# Patient Record
Sex: Male | Born: 1975 | Race: White | Hispanic: No | Marital: Married | Smoking: Never smoker
Health system: Southern US, Community
[De-identification: ages and names within clinical notes are randomized; demographics above are authoritative.]

## PROBLEM LIST (undated history)

## (undated) DIAGNOSIS — I219 Acute myocardial infarction, unspecified: Secondary | ICD-10-CM

## (undated) DIAGNOSIS — E785 Hyperlipidemia, unspecified: Secondary | ICD-10-CM

## (undated) HISTORY — DX: Hyperlipidemia, unspecified: E78.5

## (undated) HISTORY — DX: Acute myocardial infarction, unspecified: I21.9

---

## 2006-05-13 HISTORY — PX: HERNIA REPAIR: SHX51

## 2019-04-13 HISTORY — PX: CORONARY ANGIOPLASTY WITH STENT PLACEMENT: SHX49

## 2019-05-03 ENCOUNTER — Other Ambulatory Visit: Payer: Self-pay

## 2019-05-03 ENCOUNTER — Ambulatory Visit (INDEPENDENT_AMBULATORY_CARE_PROVIDER_SITE_OTHER): Payer: PRIVATE HEALTH INSURANCE | Admitting: Nurse Practitioner

## 2019-05-03 ENCOUNTER — Encounter (INDEPENDENT_AMBULATORY_CARE_PROVIDER_SITE_OTHER): Payer: Self-pay

## 2019-05-03 ENCOUNTER — Encounter (INDEPENDENT_AMBULATORY_CARE_PROVIDER_SITE_OTHER): Payer: Self-pay | Admitting: Nurse Practitioner

## 2019-05-03 ENCOUNTER — Encounter: Payer: Self-pay | Admitting: Internal Medicine

## 2019-05-03 VITALS — BP 116/76 | HR 70 | Temp 97.3°F | Resp 14 | Ht 76.0 in | Wt 268.0 lb

## 2019-05-03 DIAGNOSIS — R5383 Other fatigue: Secondary | ICD-10-CM

## 2019-05-03 DIAGNOSIS — R42 Dizziness and giddiness: Secondary | ICD-10-CM

## 2019-05-03 DIAGNOSIS — Z0001 Encounter for general adult medical examination with abnormal findings: Secondary | ICD-10-CM | POA: Insufficient documentation

## 2019-05-03 DIAGNOSIS — E785 Hyperlipidemia, unspecified: Secondary | ICD-10-CM

## 2019-05-03 DIAGNOSIS — Z131 Encounter for screening for diabetes mellitus: Secondary | ICD-10-CM

## 2019-05-03 DIAGNOSIS — I2510 Atherosclerotic heart disease of native coronary artery without angina pectoris: Secondary | ICD-10-CM

## 2019-05-03 HISTORY — DX: Atherosclerotic heart disease of native coronary artery without angina pectoris: I25.10

## 2019-05-03 NOTE — Patient Instructions (Signed)
Thank you for choosing [facility] as your medical provider! If you have any questions or concerns regarding your health care, please do not hesitate to call our office.  Blood pressure: Reduce your lisinopril dose from 10 mg to 5 mg daily.  This means take 1/2 tablet a day.  Continue to check your blood pressure daily and write this down.  If you continue to have dizziness or lightheadedness despite reducing your blood pressure medication, please call this office prior to your next appointment.  Give yourself about 1 week to see if your symptoms improve with the reduction in this medication.  Blood work: We are collecting blood work today I will notify you of these results either via MyChart online, by letter and mail, or I will call you by the end of the week.  Immunizations: If you change your mind and you would like the flu shot, please notify us we will administer this for you.  Coronary artery disease/heart attack: If you do not hear from the cardiologist office regarding scheduling a follow-up, please call this office within the next week so we can refer you to a different cardiologist.  Healthy diet: Please try to follow the dietary practices we discussed in the office.   Please follow-up as scheduled in 6 weeks. We look forward to seeing you again soon! Have a great Christmas!!  At [facility] we value your feedback. You may receive a survey about your visit today. Please share your experience as we strive to create trusting relationships with our patients to provide genuine, compassionate, quality care.  We appreciate your understanding and patience as we review any laboratory studies, imaging, and other diagnostic tests that are ordered as we care for you. We do our best to address any and all results in a timely manner. If you do not hear about test results within 1 week, please do not hesitate to contact us. If we referred you to a specialist during your visit or  ordered imaging testing, contact the office if you have not been contacted to be scheduled within 1 weeks.  We also encourage the use of MyChart, which contains your medical information for your review as well. If you are not enrolled in this feature, an access code is on this after visit summary for your convenience. Thank you for allowing [place] to be involved in your care.

## 2019-05-03 NOTE — Progress Notes (Addendum)
Subjective:  Patient ID: Clifford Lozano, male    DOB: [birth year]  Age: 43 y.o. MRN: [id number]  CC:  Chief Complaint  Patient presents with  . Establish Care      HPI  This patient arrives to the office today to establish care as a new patient.  He was recently discharged from the hospital last week after undergoing cardiac stent placement and thrombectomy following STEMI of inferior wall.  He also had acute kidney injury probably secondary to IV contrast administration during his visit.  Is also noted that he had hyperglycemia without a medical history of diabetes.  He arrives with his after visit summary from the hospital visit.  He was seen at [facility] in [place], and I do not think we have access to those records currently.  Today, he tells me he generally is feeling well and is not experiencing any additional chest discomfort.  He tells me when he had his STEMI he did experience chest pain that radiated to his left arm, back, and jaw.  He also had some diaphoresis as well.  He tells me the pain initially occurred on 04/27/2019, but then went away after 10 minutes.  He then had a repeat of the pain with the diaphoresis on 04/28/2019 at which point he proceeded to the emergency department and was diagnosed with STEMI.  He is currently on beta-blocker, ACE inhibitor, statin, Effient, and low-dose aspirin.  He reports he has been taking these medications as prescribed.  He does report that he has had some mild dizziness/lightheadedness since being discharged from the hospital.  He does check his blood pressure at home and tells me it has been running in the 90s to low 100s systolically and in the 50s to 60s diastolically.  Past Medical History:  Diagnosis Date  . Coronary artery disease 05/03/2019  . Hyperlipidemia   . Myocardial infarction [facility])    04/28/2019 - STEMI s/p 2 stent placement      Family History  Problem Relation Age of Onset  . Cancer Mother        Liver  Cancer  . Alcohol abuse Father   . Cancer Sister        unknown type    Social History   Social History Narrative  . Not on file   Social History   Tobacco Use  . Smoking status: Never Smoker  . Smokeless tobacco: Former Neurosurgeon    Types: Chew  Substance Use Topics  . Alcohol use: Yes    Comment: weekly     Current Meds  Medication Sig  . aspirin EC 81 MG tablet Take 81 mg by mouth daily.  . carvedilol (COREG) 3.125 MG tablet Take 3.125 mg by mouth 2 (two) times daily with a meal.  . lisinopril (ZESTRIL) 5 MG tablet Take 5 mg by mouth daily.  . nitroGLYCERIN (NITROSTAT) 0.4 MG SL tablet Place 0.4 mg under the tongue every 5 (five) minutes as needed for chest pain.  Marland Kitchen omeprazole (PRILOSEC) 20 MG capsule Take 20 mg by mouth daily.  . prasugrel (EFFIENT) 10 MG TABS tablet Take 10 mg by mouth daily.  . rosuvastatin (CRESTOR) 40 MG tablet Take 40 mg by mouth daily.  . [DISCONTINUED] lisinopril (ZESTRIL) 10 MG tablet Take 10 mg by mouth daily.    ROS:  Review of Systems  Constitutional: Positive for malaise/fatigue. Negative for fever.  Eyes: Negative for blurred vision.  Respiratory: Negative for cough, shortness of breath and wheezing.  Cardiovascular: Negative for chest pain.  Gastrointestinal: Positive for heartburn. Negative for abdominal pain, blood in stool, nausea and vomiting.  Neurological: Positive for dizziness (lightheaded ) and headaches.  Psychiatric/Behavioral: Negative for suicidal ideas.     Objective:   Today's Vitals: BP 116/76   Pulse 70   Temp (!) 97.3 F (36.3 C)   Resp 14   Ht 6\' 4"  (1.93 m)   Wt 268 lb (121.6 kg)   SpO2 99%   BMI 32.62 kg/m  Vitals with BMI 05/03/2019  Height 6\' 4"   Weight 268 lbs  BMI 93.23  Systolic 557  Diastolic 76  Pulse 70     Physical Exam Vitals reviewed.  Constitutional:      Appearance: Normal appearance.  HENT:     Head: Normocephalic and atraumatic.  Cardiovascular:     Rate and Rhythm: Normal  rate and regular rhythm.  Pulmonary:     Effort: Pulmonary effort is normal.     Breath sounds: Normal breath sounds.  Musculoskeletal:     Cervical back: Neck supple.  Skin:    General: Skin is warm and dry.  Neurological:     Mental Status: He is alert and oriented to person, place, and time.  Psychiatric:        Mood and Affect: Mood normal.        Behavior: Behavior normal.        Thought Content: Thought content normal.        Judgment: Judgment normal.      EKG: Sinus rhythm with heart rate of 61.  Q-wave noted in aVF.  Inverted T waves noted in lead III and aVF.    Assessment   1. Fatigue, unspecified type   2. Abnormal physical evaluation   3. Dizziness   4. Coronary artery disease involving native heart without angina pectoris, unspecified vessel or lesion type   5. Screening for diabetes mellitus   6. Hyperlipidemia, unspecified hyperlipidemia type   7. Encounter for general adult medical examination with abnormal findings       Tests ordered Orders Placed This Encounter  Procedures  . CBC  . CMP  . Hemoglobin A1c  . TSH  . T3, Free  . T4, Free  . Vitamin D, 25-hydroxy     Plan: Please see assessment and plan per problem list below.  Patient declined offer to administer flu vaccine today.   No orders of the defined types were placed in this encounter.   Patient to follow-up in 6 weeks at which time we will collect fasting lipid panel, since he was started on rosuvastatin while he was in the hospital. He will also be due for his annual physical exam at that time. He was encouraged to call this office with any questions or concerns prior to his next appointment.  Ailene Ards, NP

## 2019-05-03 NOTE — Assessment & Plan Note (Addendum)
He will continue on medication as prescribed.  He tells me he has not yet heard from the cardiologist office for follow-up now that he has been discharged in the hospital.  I told him if he does not hear about setting up a follow-up appointment within the next week that he needs to let us know and we will need to refer him to a different cardiologist.  He tells me he understands.  As far as his blood pressure goes, I encouraged him to continue monitoring this at home.  We will reduce his lisinopril from 5 mg to 10 mg daily to see if this improves his symptoms.  I told him if the dizziness/lightheadedness continues despite reducing the medication he is notify us.  He tells me he understands.  I told him that with the reduction in the dose he should continue monitoring this at home, and the goal is to keep his blood pressure less than 120 over less than 80.  I told him to let us know if his blood pressure becomes higher than this after reducing his lisinopril.  He tells me he understands.

## 2019-05-03 NOTE — Assessment & Plan Note (Signed)
I will also collect screening blood work today for further evaluation as he has not been to a primary care physician in many years.  Further recommendations will be made based upon these results.

## 2019-05-03 NOTE — Assessment & Plan Note (Signed)
This very well could be related to medications that he has been started on since his discharge from the hospital.  However we will collect blood work for further evaluation today.

## 2019-05-04 ENCOUNTER — Encounter (INDEPENDENT_AMBULATORY_CARE_PROVIDER_SITE_OTHER): Payer: Self-pay | Admitting: Nurse Practitioner

## 2019-05-04 LAB — HEMOGLOBIN A1C
Hgb A1c MFr Bld: 5.7 % of total Hgb — ABNORMAL HIGH (ref <5.7)
Mean Plasma Glucose: 117 (calc)
eAG (mmol/L): 6.5 (calc)

## 2019-05-04 LAB — COMPREHENSIVE METABOLIC PANEL
AG Ratio: 1.5 (calc) (ref 1.0–2.5)
ALT: 47 U/L — ABNORMAL HIGH (ref 9–46)
AST: 23 U/L (ref 10–40)
Albumin: 3.9 g/dL (ref 3.6–5.1)
Alkaline phosphatase (APISO): 300 U/L — ABNORMAL HIGH (ref 36–130)
BUN/Creatinine Ratio: 16 (calc) (ref 6–22)
BUN: 22 mg/dL (ref 7–25)
CO2: 26 mmol/L (ref 20–32)
Calcium: 9.2 mg/dL (ref 8.6–10.3)
Chloride: 103 mmol/L (ref 98–110)
Creat: 1.36 mg/dL — ABNORMAL HIGH (ref 0.60–1.35)
Globulin: 2.6 g/dL (calc) (ref 1.9–3.7)
Glucose, Bld: 102 mg/dL — ABNORMAL HIGH (ref 65–99)
Potassium: 4.6 mmol/L (ref 3.5–5.3)
Sodium: 138 mmol/L (ref 135–146)
Total Bilirubin: 0.8 mg/dL (ref 0.2–1.2)
Total Protein: 6.5 g/dL (ref 6.1–8.1)

## 2019-05-04 LAB — CBC
HCT: 42.5 % (ref 38.5–50.0)
Hemoglobin: 14.6 g/dL (ref 13.2–17.1)
MCH: 31.1 pg (ref 27.0–33.0)
MCHC: 34.4 g/dL (ref 32.0–36.0)
MCV: 90.4 fL (ref 80.0–100.0)
MPV: 11.7 fL (ref 7.5–12.5)
Platelets: 307 10*3/uL (ref 140–400)
RBC: 4.7 10*6/uL (ref 4.20–5.80)
RDW: 12.2 % (ref 11.0–15.0)
WBC: 8.4 10*3/uL (ref 3.8–10.8)

## 2019-05-04 LAB — VITAMIN D 25 HYDROXY (VIT D DEFICIENCY, FRACTURES): Vit D, 25-Hydroxy: 26 ng/mL — ABNORMAL LOW (ref 30–100)

## 2019-05-04 LAB — TSH: TSH: 2.55 mIU/L (ref 0.40–4.50)

## 2019-05-04 LAB — T4, FREE: Free T4: 1.2 ng/dL (ref 0.8–1.8)

## 2019-05-04 LAB — T3, FREE: T3, Free: 2.7 pg/mL (ref 2.3–4.2)

## 2019-05-04 NOTE — Progress Notes (Signed)
Will discuss majority of results with patient at his next office visit. I did consult with Dr. Anastasio Champion regarding elevated alkaline phosphatase and ALT. Patient was asymptomatic during office visit, thus will recheck values at his next follow-up. Patient was notified to call us if he starts to experience abdominal pain.

## 2019-06-08 ENCOUNTER — Ambulatory Visit (INDEPENDENT_AMBULATORY_CARE_PROVIDER_SITE_OTHER): Payer: Self-pay | Admitting: Internal Medicine

## 2019-06-29 ENCOUNTER — Encounter (INDEPENDENT_AMBULATORY_CARE_PROVIDER_SITE_OTHER): Payer: Self-pay | Admitting: Nurse Practitioner

## 2019-06-29 ENCOUNTER — Ambulatory Visit (INDEPENDENT_AMBULATORY_CARE_PROVIDER_SITE_OTHER): Payer: PRIVATE HEALTH INSURANCE | Admitting: Nurse Practitioner

## 2019-06-29 ENCOUNTER — Other Ambulatory Visit: Payer: Self-pay

## 2019-06-29 VITALS — BP 115/78 | HR 87 | Temp 98.2°F | Ht 75.0 in | Wt 270.8 lb

## 2019-06-29 DIAGNOSIS — K5900 Constipation, unspecified: Secondary | ICD-10-CM

## 2019-06-29 DIAGNOSIS — I2510 Atherosclerotic heart disease of native coronary artery without angina pectoris: Secondary | ICD-10-CM | POA: Diagnosis not present

## 2019-06-29 DIAGNOSIS — R748 Abnormal levels of other serum enzymes: Secondary | ICD-10-CM | POA: Diagnosis not present

## 2019-06-29 DIAGNOSIS — R7303 Prediabetes: Secondary | ICD-10-CM

## 2019-06-29 DIAGNOSIS — R5383 Other fatigue: Secondary | ICD-10-CM | POA: Diagnosis not present

## 2019-06-29 DIAGNOSIS — E559 Vitamin D deficiency, unspecified: Secondary | ICD-10-CM

## 2019-06-29 MED ORDER — DOCUSATE SODIUM 50 MG PO CAPS: 50.0000 mg | ORAL_CAPSULE | Freq: Two times a day (BID) | ORAL | 0 refills | Status: DC

## 2019-06-29 NOTE — Assessment & Plan Note (Signed)
I did discuss with him his serum vitamin D level and that I would recommend he start a vitamin D3 supplement.  I recommended that he start 2000 IUs daily, he tells me he will do this.

## 2019-06-29 NOTE — Patient Instructions (Signed)
Thank you for choosing [facility] as your medical provider! If you have any questions or concerns regarding your health care, please do not hesitate to call our office.  Continue your medications as prescribed.  I am repeating blood work today for further evaluation of some mild abnormalities noted on your last blood work.  I will let you know results and recommendations via MyChart.  Please pick up some vitamin D3 (2000 IUs) over-the-counter at your pharmacy.  Take 1 tablet/capsule by mouth daily.  Please try to adhere to a healthy diet full of plant-based foods and when you do eat meat choose small portions and lean cuts.  Please follow-up as scheduled in 3 months. We look forward to seeing you again soon!   At [facility] we value your feedback. You may receive a survey about your visit today. Please share your experience as we strive to create trusting relationships with our patients to provide genuine, compassionate, quality care.  We appreciate your understanding and patience as we review any laboratory studies, imaging, and other diagnostic tests that are ordered as we care for you. We do our best to address any and all results in a timely manner. If you do not hear about test results within 1 week, please do not hesitate to contact us. If we referred you to a specialist during your visit or ordered imaging testing, contact the office if you have not been contacted to be scheduled within 1 weeks.  We also encourage the use of MyChart, which contains your medical information for your review as well. If you are not enrolled in this feature, an access code is on this after visit summary for your convenience. Thank you for allowing [place] to be involved in your care.

## 2019-06-29 NOTE — Assessment & Plan Note (Signed)
I did discuss his A1c with him today and what this means.  He tells me that he was eating a lot of processed carbohydrates and sweets prior to his heart attack and has cut these out almost completely in his diet.  I encouraged him to continue avoiding these foods, consider rechecking A1c in approximately 6 months.

## 2019-06-29 NOTE — Assessment & Plan Note (Signed)
His energy levels seem to have improved with the changes in his blood pressure medications.  No further changes will be made at this time.

## 2019-06-29 NOTE — Assessment & Plan Note (Signed)
Patient does report that he is experiencing some mild constipation.  He tells me that he will sometimes have difficulty passing stool and his stool is hard and dry in appearance.  He tells me he does drink about 8 glasses of water a day.  I offered to prescribe a mild stool softener and he would like to try this as needed.  Colace prescribed that he can take twice a day as needed to his pharmacy.

## 2019-06-29 NOTE — Assessment & Plan Note (Signed)
I will recollect patient's metabolic panel for further evaluation of his elevated liver enzymes and elevated creatinine.  Further recommendations will be made based upon these results.

## 2019-06-29 NOTE — Assessment & Plan Note (Signed)
He is encouraged to continue his medications as prescribed and to follow-up with his cardiologist as scheduled.  I encouraged him to continue trying to eat healthy foods and focus on plant-based choices.  He tells me he understands.

## 2019-06-29 NOTE — Progress Notes (Signed)
Subjective:  Patient ID: Clifford Lozano, male    DOB: 04-Dec-1975  Age: 44 y.o. MRN: [id number]  CC:  Chief Complaint  Patient presents with  . Follow-up    CAD, prediabetes, vitamin D deficiency, elevated liver enzymes      HPI  This patient arrives today for follow-up of the above.  Coronary artery disease: He was seen in this office approximately 6 weeks ago as a new patient.  He does have a history of coronary artery disease and unfortunately did have a STEMI in late 2020.  He is following up with cardiology and denies any recent chest pain or worsening fatigue/shortness of breath.  He tells me his cardiologist actually took him off of his beta-blocker, but he continues on his ACE inhibitor, statin, Effient, and low-dose aspirin.  At his last office visit his blood pressure was running on the low side and he had complained of fatigue as well as intermittent dizziness and lightheadedness.  At that time we reduced him from 10 mg of lisinopril down to 5 mg of lisinopril daily.  He tells me he has been monitoring his blood pressure at home and it has been running in the 110s/70s and he feels much better with his blood pressure in this range.  I also did discuss the importance of following a healthy diet focused on plant-based foods his last office visit.  He tells me since his last office visit he has been eating mostly vegetables and meat.  Sometimes he will eat some bread, but not very often.  He tries to focus on chicken and [place], but will sometimes eat beef and venison.  Vitamin D deficiency: New patient blood work did reveal the patient does have vitamin D deficiency.  Last serum level was in the 20s and this was collected in December 2020.  He is not currently on any vitamin D3 supplement.  Elevated liver enzymes: He also had elevated ALT of 47 and elevated alkaline phosphatase of 300.  He continues to deny any abdominal pain, changes to the color of his stool, or changes to his  skin or eye color.  Prediabetes: He was also found to be prediabetic on his initial new patient blood work.  A1c was 5.7 in December 2020     Past Medical History:  Diagnosis Date  . Coronary artery disease 05/03/2019  . Hyperlipidemia   . Myocardial infarction [facility])    04/28/2019 - STEMI s/p 2 stent placement      Family History  Problem Relation Age of Onset  . Cancer Mother        Liver Cancer  . Alcohol abuse Father   . Cancer Sister        unknown type    Social History   Social History Narrative  . Not on file   Social History   Tobacco Use  . Smoking status: Never Smoker  . Smokeless tobacco: Former Systems developer    Types: Chew  Substance Use Topics  . Alcohol use: Yes    Comment: weekly     Current Meds  Medication Sig  . aspirin EC 81 MG tablet Take 81 mg by mouth daily.  Marland Kitchen lisinopril (ZESTRIL) 5 MG tablet Take 5 mg by mouth daily.  . nitroGLYCERIN (NITROSTAT) 0.4 MG SL tablet Place 0.4 mg under the tongue every 5 (five) minutes as needed for chest pain.  Marland Kitchen omeprazole (PRILOSEC) 20 MG capsule Take 20 mg by mouth daily.  . prasugrel (EFFIENT) 10  MG TABS tablet Take 10 mg by mouth daily.  . rosuvastatin (CRESTOR) 40 MG tablet Take 40 mg by mouth daily.    ROS:  Review of Systems  Constitutional: Negative for fever and malaise/fatigue.  Respiratory: Negative for cough, shortness of breath and wheezing.   Cardiovascular: Negative for chest pain and palpitations.  Gastrointestinal: Positive for constipation. Negative for abdominal pain and nausea.     Objective:   Today's Vitals: BP 115/78 (BP Location: Left Arm, Patient Position: Sitting, Cuff Size: Normal)   Pulse 87   Temp 98.2 F (36.8 C) (Temporal)   Ht 6' 3"  (1.905 m)   Wt 270 lb 12.8 oz (122.8 kg)   SpO2 97%   BMI 33.85 kg/m  Vitals with BMI 06/29/2019 05/03/2019  Height 6' 3"  6' 4"   Weight 270 lbs 13 oz 268 lbs  BMI 98.26 41.58  Systolic 309 407  Diastolic 78 76  Pulse 87 70      Physical Exam Vitals reviewed.  Constitutional:      Appearance: Normal appearance.  HENT:     Head: Normocephalic and atraumatic.  Cardiovascular:     Rate and Rhythm: Normal rate and regular rhythm.  Pulmonary:     Effort: Pulmonary effort is normal.     Breath sounds: Normal breath sounds.  Musculoskeletal:     Cervical back: Neck supple.  Skin:    General: Skin is warm and dry.  Neurological:     Mental Status: He is alert and oriented to person, place, and time.  Psychiatric:        Mood and Affect: Mood normal.        Behavior: Behavior normal.        Thought Content: Thought content normal.        Judgment: Judgment normal.          Assessment   1. Elevated liver enzymes   2. Constipation, unspecified constipation type       Tests ordered Orders Placed This Encounter  Procedures  . CMP with eGFR([facility])  . Lipid Panel     Plan: Please see assessment and plan per problem list below.   Meds ordered this encounter  Medications  . docusate sodium (COLACE) 50 MG capsule    Sig: Take 1 capsule (50 mg total) by mouth 2 (two) times daily.    Dispense:  30 capsule    Refill:  0    Order Specific Question:   Supervising Provider    Answer:   Doree Albee [[id number]]    Patient to follow-up in 3 months  Ailene Ards, NP

## 2019-06-30 ENCOUNTER — Other Ambulatory Visit (INDEPENDENT_AMBULATORY_CARE_PROVIDER_SITE_OTHER): Payer: Self-pay | Admitting: Nurse Practitioner

## 2019-06-30 DIAGNOSIS — R748 Abnormal levels of other serum enzymes: Secondary | ICD-10-CM

## 2019-06-30 LAB — COMPLETE METABOLIC PANEL WITH GFR
AG Ratio: 1.7 (calc) (ref 1.0–2.5)
ALT: 53 U/L — ABNORMAL HIGH (ref 9–46)
AST: 24 U/L (ref 10–40)
Albumin: 4.4 g/dL (ref 3.6–5.1)
Alkaline phosphatase (APISO): 173 U/L — ABNORMAL HIGH (ref 36–130)
BUN/Creatinine Ratio: 16 (calc) (ref 6–22)
BUN: 23 mg/dL (ref 7–25)
CO2: 25 mmol/L (ref 20–32)
Calcium: 9.8 mg/dL (ref 8.6–10.3)
Chloride: 102 mmol/L (ref 98–110)
Creat: 1.46 mg/dL — ABNORMAL HIGH (ref 0.60–1.35)
GFR, Est African American: 66 mL/min/{1.73_m2} (ref >=60)
GFR, Est Non African American: 57 mL/min/{1.73_m2} — ABNORMAL LOW (ref >=60)
Globulin: 2.6 g/dL (calc) (ref 1.9–3.7)
Glucose, Bld: 83 mg/dL (ref 65–99)
Potassium: 4.9 mmol/L (ref 3.5–5.3)
Sodium: 139 mmol/L (ref 135–146)
Total Bilirubin: 0.4 mg/dL (ref 0.2–1.2)
Total Protein: 7 g/dL (ref 6.1–8.1)

## 2019-06-30 LAB — LIPID PANEL
Cholesterol: 139 mg/dL (ref <200)
HDL: 52 mg/dL (ref >=40)
LDL Cholesterol (Calc): 65 mg/dL (calc)
Non-HDL Cholesterol (Calc): 87 mg/dL (calc) (ref <130)
Total CHOL/HDL Ratio: 2.7 (calc) (ref <5.0)
Triglycerides: 135 mg/dL (ref <150)

## 2019-06-30 LAB — SPECIMEN COMPROMISED

## 2019-07-15 NOTE — Progress Notes (Signed)
Pt was called and given instructions per Jiles Prows, NP-C notes. Pt is wanting to know is the current medications he started taking is the reason this happened? Because he never had any issues until he started taking medications.

## 2019-07-15 NOTE — Progress Notes (Signed)
Update: Patient underwent abdominal ultrasound and significant abnormalities include borderline hepatomegaly and several gallbladder polyps noted.  Largest measuring up to 3 mm.  Recommendation is to repeat ultrasound in 1 year.  Nellie, please call the patient and notify him that his ultrasound showed borderline enlarged liver with some polyps noted in his gallbladder.  He will need to have repeat ultrasound in approximately 1 year to monitor the polyps.  If he starts to feel unwell including experiencing abdominal pain, skin color changes, unexplained nausea or vomiting, excessive skin itchiness, changes in his bowel habits, unexplained fatigue or weight loss he should let us know right away.  Thank you.

## 2019-09-28 ENCOUNTER — Other Ambulatory Visit: Payer: Self-pay

## 2019-09-28 ENCOUNTER — Encounter (INDEPENDENT_AMBULATORY_CARE_PROVIDER_SITE_OTHER): Payer: Self-pay | Admitting: Internal Medicine

## 2019-09-28 ENCOUNTER — Ambulatory Visit (INDEPENDENT_AMBULATORY_CARE_PROVIDER_SITE_OTHER): Payer: PRIVATE HEALTH INSURANCE | Admitting: Internal Medicine

## 2019-09-28 VITALS — BP 125/75 | HR 86 | Temp 99.7°F | Ht 76.8 in | Wt 262.4 lb

## 2019-09-28 DIAGNOSIS — E559 Vitamin D deficiency, unspecified: Secondary | ICD-10-CM

## 2019-09-28 DIAGNOSIS — R748 Abnormal levels of other serum enzymes: Secondary | ICD-10-CM | POA: Diagnosis not present

## 2019-09-28 DIAGNOSIS — R7303 Prediabetes: Secondary | ICD-10-CM

## 2019-09-28 DIAGNOSIS — I2510 Atherosclerotic heart disease of native coronary artery without angina pectoris: Secondary | ICD-10-CM

## 2019-09-28 LAB — COMPLETE METABOLIC PANEL WITH GFR
AG Ratio: 1.7 (calc) (ref 1.0–2.5)
ALT: 36 U/L (ref 9–46)
AST: 20 U/L (ref 10–40)
Albumin: 4 g/dL (ref 3.6–5.1)
Alkaline phosphatase (APISO): 116 U/L (ref 36–130)
BUN: 21 mg/dL (ref 7–25)
CO2: 28 mmol/L (ref 20–32)
Calcium: 9.5 mg/dL (ref 8.6–10.3)
Chloride: 106 mmol/L (ref 98–110)
Creat: 1.28 mg/dL (ref 0.60–1.35)
GFR, Est African American: 77 mL/min/{1.73_m2} (ref >=60)
GFR, Est Non African American: 67 mL/min/{1.73_m2} (ref >=60)
Globulin: 2.3 g/dL (calc) (ref 1.9–3.7)
Glucose, Bld: 88 mg/dL (ref 65–99)
Potassium: 4.5 mmol/L (ref 3.5–5.3)
Sodium: 142 mmol/L (ref 135–146)
Total Bilirubin: 0.5 mg/dL (ref 0.2–1.2)
Total Protein: 6.3 g/dL (ref 6.1–8.1)

## 2019-09-28 NOTE — Progress Notes (Signed)
Metrics: Intervention Frequency ACO  Documented Smoking Status Yearly  Screened one or more times in 24 months  Cessation Counseling or  Active cessation medication Past 24 months  Past 24 months   Guideline developer: UpToDate (See UpToDate for funding source) Date Released: 2014       Wellness Office Visit  Subjective:  Patient ID: Clifford Lozano, male    DOB: 15-Nov-1975  Age: 44 y.o. MRN: [id number]  CC: This man comes in for follow-up of hyperlipidemia, coronary artery disease, abnormal liver enzymes. HPI  He had a myocardial infarction in December 2020 which resulted in 2 stent placements. He is following with cardiologist in [place]. He denies any current cardiological symptoms such as chest pain. He has intermittent cough when he lies flat but no PND. He does not feel short of breath when he does his job or walks on a treadmill that he uses more on a regular basis now. He is trying to eat healthier overall and has managed to lose some weight. His liver enzyme have been elevated and so he was keeping a check on this. He did have an ultrasound of his abdomen in March 2021 which showed slightly enlarged liver, gallbladder polyps which need to be followed up in 1 year but no other major abnormalities. Past Medical History:  Diagnosis Date  . Coronary artery disease 05/03/2019  . Hyperlipidemia   . Myocardial infarction [facility])    04/28/2019 - STEMI s/p 2 stent placement      Family History  Problem Relation Age of Onset  . Cancer Mother        Liver Cancer  . Alcohol abuse Father   . Cancer Sister        unknown type    Social History   Social History Narrative   Married for 26 years.Lives with wife and 62 year old son.Works for [employer].   Social History   Tobacco Use  . Smoking status: Never Smoker  . Smokeless tobacco: Former Neurosurgeon    Types: Chew  Substance Use Topics  . Alcohol use: Yes    Comment: weekly    Current Meds  Medication Sig  .  aspirin EC 81 MG tablet Take 81 mg by mouth daily.  Marland Kitchen lisinopril (ZESTRIL) 5 MG tablet Take 5 mg by mouth daily.  . nitroGLYCERIN (NITROSTAT) 0.4 MG SL tablet Place 0.4 mg under the tongue every 5 (five) minutes as needed for chest pain.  Marland Kitchen omeprazole (PRILOSEC) 20 MG capsule Take 20 mg by mouth daily.  . prasugrel (EFFIENT) 10 MG TABS tablet Take 10 mg by mouth daily.  . rosuvastatin (CRESTOR) 40 MG tablet Take 40 mg by mouth daily.      Objective:   Today's Vitals: BP 125/75 (BP Location: Left Arm, Patient Position: Sitting, Cuff Size: Normal)   Pulse 86   Temp 99.7 F (37.6 C) (Temporal)   Ht 6' 4.8" (1.951 m)   Wt 262 lb 6.4 oz (119 kg)   SpO2 98%   BMI 31.28 kg/m  Vitals with BMI 09/28/2019 06/29/2019 05/03/2019  Height 6' 4.8" 6\' 3"  6\' 4"   Weight 262 lbs 6 oz 270 lbs 13 oz 268 lbs  BMI 31.27 33.85 32.64  Systolic 125 115  Diastolic 75 78 76  Pulse 86 87 70     Physical Exam  He looks systemically well. Blood pressure is well controlled. He has lost 8 pounds since the last visit. No major abnormalities.     Assessment  1. Coronary artery disease involving native heart without angina pectoris, unspecified vessel or lesion type   2. Elevated liver enzymes   3. Prediabetes   4. Vitamin D deficiency       Tests ordered Orders Placed This Encounter  Procedures  . COMPLETE METABOLIC PANEL WITH GFR     Plan: 1. We will check his liver enzymes again. 2. We discussed the importance of exercise and he should start walking on the treadmill briskly 30 minutes every day if possible. 3. I will see him for follow-up in 3 months. 4. He will continue with statin therapy for his hyperlipidemia. 5. He will continue with antihypertensive therapy for his hypertension.   No orders of the defined types were placed in this encounter.   Doree Albee, MD

## 2019-10-04 NOTE — Progress Notes (Signed)
Patient called.  Ask to go into his my chart to see lab results and instructions from Dr Karilyn Cota. He may reply back if there is something he need.

## 2019-12-29 ENCOUNTER — Encounter (INDEPENDENT_AMBULATORY_CARE_PROVIDER_SITE_OTHER): Payer: Self-pay | Admitting: Internal Medicine

## 2019-12-29 ENCOUNTER — Ambulatory Visit (INDEPENDENT_AMBULATORY_CARE_PROVIDER_SITE_OTHER): Payer: PRIVATE HEALTH INSURANCE | Admitting: Internal Medicine

## 2019-12-29 ENCOUNTER — Other Ambulatory Visit: Payer: Self-pay

## 2019-12-29 VITALS — BP 130/80 | HR 79 | Temp 97.3°F | Resp 17 | Ht 75.0 in | Wt 262.0 lb

## 2019-12-29 DIAGNOSIS — I2510 Atherosclerotic heart disease of native coronary artery without angina pectoris: Secondary | ICD-10-CM | POA: Diagnosis not present

## 2019-12-29 DIAGNOSIS — E785 Hyperlipidemia, unspecified: Secondary | ICD-10-CM

## 2019-12-29 DIAGNOSIS — R7303 Prediabetes: Secondary | ICD-10-CM

## 2019-12-29 NOTE — Progress Notes (Signed)
Metrics: Intervention Frequency ACO  Documented Smoking Status Yearly  Screened one or more times in 24 months  Cessation Counseling or  Active cessation medication Past 24 months  Past 24 months   Guideline developer: UpToDate (See UpToDate for funding source) Date Released: 2014       Wellness Office Visit  Subjective:  Patient ID: Clifford Lozano, male    DOB: [birth year]  Age: 44 y.o. MRN: [id number]  CC: This man comes in for follow-up of hyperlipidemia, prediabetes and coronary artery disease. HPI  He has a cardiologist in [place] and he will be seeing him in October of this year. He continues on statin therapy for his hyperlipidemia in the face of diabetes. He continues on lisinopril for his hypertension. He was noted to be a prediabetic but since that time, he has made some small changes in his diet and has stopped eating junk food is much as he can. He denies any chest pain, dyspnea, palpitations or limb weakness Past Medical History:  Diagnosis Date  . Coronary artery disease 05/03/2019  . Hyperlipidemia   . Myocardial infarction [facility])    04/28/2019 - STEMI s/p 2 stent placement   Past Surgical History:  Procedure Laterality Date  . CORONARY ANGIOPLASTY WITH STENT PLACEMENT  04/2019  . HERNIA REPAIR  2008     Family History  Problem Relation Age of Onset  . Cancer Mother        Liver Cancer  . Alcohol abuse Father   . Cancer Sister        unknown type    Social History   Social History Narrative   Married for 26 years.Lives with wife and 39 year old son.Works for [employer].   Social History   Tobacco Use  . Smoking status: Never Smoker  . Smokeless tobacco: Former Neurosurgeon    Types: Chew  Substance Use Topics  . Alcohol use: Yes    Comment: weekly    Current Meds  Medication Sig  . aspirin EC 81 MG tablet Take 81 mg by mouth daily.  Marland Kitchen lisinopril (ZESTRIL) 5 MG tablet Take 5 mg by mouth daily.  . nitroGLYCERIN (NITROSTAT) 0.4 MG  SL tablet Place 0.4 mg under the tongue every 5 (five) minutes as needed for chest pain.  Marland Kitchen omeprazole (PRILOSEC) 20 MG capsule Take 20 mg by mouth daily.  . prasugrel (EFFIENT) 10 MG TABS tablet Take 10 mg by mouth daily.  . rosuvastatin (CRESTOR) 40 MG tablet Take 40 mg by mouth daily.       Depression screen [facility] 2/9 06/29/2019  Decreased Interest 0  Down, Depressed, Hopeless 0  PHQ - 2 Score 0     Objective:   Today's Vitals: BP 130/80 (BP Location: Right Arm, Patient Position: Sitting, Cuff Size: Normal)   Pulse 79   Temp (!) 97.3 F (36.3 C) (Temporal)   Resp 17   Ht 6\' 3"  (1.905 m)   Wt 262 lb (118.8 kg) Comment: with work & boots on.  SpO2 98%   BMI 32.75 kg/m  Vitals with BMI 12/29/2019 09/28/2019 06/29/2019  Height 6\' 3"  6' 4.8" 6\' 3"   Weight 262 lbs 262 lbs 6 oz 270 lbs 13 oz  BMI 32.75 31.27 33.85  Systolic 130 125 07/01/2019  Diastolic 80 75 78  Pulse 79 86 87     Physical Exam  He remains obese.  Blood pressure is reasonable.  There is no weight loss.     Assessment  1. Prediabetes   2. Coronary artery disease involving native heart without angina pectoris, unspecified vessel or lesion type   3. Hyperlipidemia, unspecified hyperlipidemia type       Tests ordered Orders Placed This Encounter  Procedures  . COMPLETE METABOLIC PANEL WITH GFR  . Lipid panel  . Hemoglobin A1c     Plan: 1. He will continue with statin therapy and we will check a lipid panel. 2. He will continue with lisinopril as before. 3. We will check an A1c to see how his prediabetes is. 4. Further recommendations will depend on blood results and I will see him in 3 to 4 months time for follow-up   No orders of the defined types were placed in this encounter.   Wilson Singer, MD

## 2019-12-30 LAB — HEMOGLOBIN A1C
Hgb A1c MFr Bld: 5.4 % of total Hgb (ref <5.7)
Mean Plasma Glucose: 108 (calc)
eAG (mmol/L): 6 (calc)

## 2019-12-30 LAB — COMPLETE METABOLIC PANEL WITH GFR
AG Ratio: 1.8 (calc) (ref 1.0–2.5)
ALT: 38 U/L (ref 9–46)
AST: 23 U/L (ref 10–40)
Albumin: 4.2 g/dL (ref 3.6–5.1)
Alkaline phosphatase (APISO): 97 U/L (ref 36–130)
BUN: 21 mg/dL (ref 7–25)
CO2: 27 mmol/L (ref 20–32)
Calcium: 9.5 mg/dL (ref 8.6–10.3)
Chloride: 101 mmol/L (ref 98–110)
Creat: 1.32 mg/dL (ref 0.60–1.35)
GFR, Est African American: 74 mL/min/{1.73_m2} (ref >=60)
GFR, Est Non African American: 64 mL/min/{1.73_m2} (ref >=60)
Globulin: 2.4 g/dL (calc) (ref 1.9–3.7)
Glucose, Bld: 92 mg/dL (ref 65–99)
Potassium: 4.1 mmol/L (ref 3.5–5.3)
Sodium: 137 mmol/L (ref 135–146)
Total Bilirubin: 0.7 mg/dL (ref 0.2–1.2)
Total Protein: 6.6 g/dL (ref 6.1–8.1)

## 2019-12-30 LAB — LIPID PANEL
Cholesterol: 156 mg/dL (ref <200)
HDL: 53 mg/dL (ref >=40)
LDL Cholesterol (Calc): 83 mg/dL (calc)
Non-HDL Cholesterol (Calc): 103 mg/dL (calc) (ref <130)
Total CHOL/HDL Ratio: 2.9 (calc) (ref <5.0)
Triglycerides: 100 mg/dL (ref <150)

## 2020-04-19 ENCOUNTER — Encounter (INDEPENDENT_AMBULATORY_CARE_PROVIDER_SITE_OTHER): Payer: Self-pay | Admitting: Internal Medicine

## 2020-04-19 ENCOUNTER — Other Ambulatory Visit: Payer: Self-pay

## 2020-04-19 ENCOUNTER — Ambulatory Visit (INDEPENDENT_AMBULATORY_CARE_PROVIDER_SITE_OTHER): Payer: PRIVATE HEALTH INSURANCE | Admitting: Internal Medicine

## 2020-04-19 VITALS — BP 133/70 | HR 76 | Temp 97.3°F | Resp 19 | Ht 75.0 in | Wt 264.2 lb

## 2020-04-19 DIAGNOSIS — I2510 Atherosclerotic heart disease of native coronary artery without angina pectoris: Secondary | ICD-10-CM | POA: Diagnosis not present

## 2020-04-19 DIAGNOSIS — Z1159 Encounter for screening for other viral diseases: Secondary | ICD-10-CM

## 2020-04-19 DIAGNOSIS — R6882 Decreased libido: Secondary | ICD-10-CM | POA: Diagnosis not present

## 2020-04-19 DIAGNOSIS — E559 Vitamin D deficiency, unspecified: Secondary | ICD-10-CM

## 2020-04-19 DIAGNOSIS — E785 Hyperlipidemia, unspecified: Secondary | ICD-10-CM

## 2020-04-19 DIAGNOSIS — Z125 Encounter for screening for malignant neoplasm of prostate: Secondary | ICD-10-CM

## 2020-04-19 NOTE — Progress Notes (Signed)
Metrics: Intervention Frequency ACO  Documented Smoking Status Yearly  Screened one or more times in 24 months  Cessation Counseling or  Active cessation medication Past 24 months  Past 24 months   Guideline developer: UpToDate (See UpToDate for funding source) Date Released: 2014       Wellness Office Visit  Subjective:  Patient ID: Clifford Lozano, male    DOB: 07/19/1975  Age: 44 y.o. MRN: [id number]  CC: This pleasant man comes in for follow-up of hyperlipidemia, vitamin D deficiency and coronary artery disease. HPI  He is doing reasonably well.  He has no further chest pain, dyspnea, palpitations or limb weakness. He continues on statin therapy as recommended by his cardiologist. He continues on lisinopril at a lower dose. He has not been taking vitamin D3 supplementation for significant vitamin D deficiency. He does describe on closer questioning decreased libido and erectile dysfunction since his heart attack in December 2020. Past Medical History:  Diagnosis Date  . Coronary artery disease 05/03/2019  . Hyperlipidemia   . Myocardial infarction [facility])    04/28/2019 - STEMI s/p 2 stent placement   Past Surgical History:  Procedure Laterality Date  . CORONARY ANGIOPLASTY WITH STENT PLACEMENT  04/2019  . HERNIA REPAIR  2008     Family History  Problem Relation Age of Onset  . Cancer Mother        Liver Cancer  . Alcohol abuse Father   . Cancer Sister        unknown type    Social History   Social History Narrative   Married for 26 years.Lives with wife and 58 year old son.Works for [employer].   Social History   Tobacco Use  . Smoking status: Never Smoker  . Smokeless tobacco: Former Neurosurgeon    Types: Chew  Substance Use Topics  . Alcohol use: Yes    Comment: weekly    Current Meds  Medication Sig  . aspirin EC 81 MG tablet Take 81 mg by mouth daily.  Marland Kitchen lisinopril (ZESTRIL) 5 MG tablet Take 5 mg by mouth daily.  . nitroGLYCERIN (NITROSTAT) 0.4  MG SL tablet Place 0.4 mg under the tongue every 5 (five) minutes as needed for chest pain.  Marland Kitchen omeprazole (PRILOSEC) 20 MG capsule Take 20 mg by mouth daily as needed.   . prasugrel (EFFIENT) 10 MG TABS tablet Take 10 mg by mouth daily.  . rosuvastatin (CRESTOR) 40 MG tablet Take 40 mg by mouth daily.      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  His weight is stable.  Blood pressure well controlled.  He remains obese.    Assessment   1. Hyperlipidemia, unspecified hyperlipidemia  type   2. Coronary artery disease involving native heart without angina pectoris, unspecified vessel or lesion type   3. Vitamin D deficiency   4. Decreased libido   5. Special screening for malignant neoplasm of prostate   6. Encounter for hepatitis C screening test for low risk patient       Tests ordered Orders Placed This Encounter  Procedures  . PSA, Total with Reflex to PSA, Free  . Testosterone Total,Free,Bio, Males  . Hepatitis C antibody     Plan: 1. I recommended he start taking vitamin D3 10,000 units daily. 2. Blood work is ordered for testosterone levels, PSA and hepatitis C antibodies. 3. Further recommendations will depend on blood results and I will see him in 3 months time for follow-up.  I did discuss COVID-19 vaccination once again and he says he still thinking about it.   No  orders of the defined types were placed in this encounter.   Wilson Singer, MD

## 2020-04-20 LAB — TESTOSTERONE TOTAL,FREE,BIO, MALES
Albumin: 4.3 g/dL (ref 3.6–5.1)
Sex Hormone Binding: 31 nmol/L (ref 10–50)
Testosterone, Bioavailable: 95.2 ng/dL — ABNORMAL LOW (ref >=110.0)
Testosterone, Free: 48.4 pg/mL (ref 46.0–224.0)
Testosterone: 352 ng/dL (ref 250–827)

## 2020-04-20 LAB — HEPATITIS C ANTIBODY
Hepatitis C Ab: NONREACTIVE
SIGNAL TO CUT-OFF: 0.01 (ref <1.00)

## 2020-04-20 LAB — PSA, TOTAL WITH REFLEX TO PSA, FREE: PSA, Total: 0.7 ng/mL (ref <=4.0)

## 2020-04-30 ENCOUNTER — Encounter (INDEPENDENT_AMBULATORY_CARE_PROVIDER_SITE_OTHER): Payer: Self-pay | Admitting: Internal Medicine

## 2020-07-18 ENCOUNTER — Other Ambulatory Visit: Payer: Self-pay

## 2020-07-18 ENCOUNTER — Ambulatory Visit (INDEPENDENT_AMBULATORY_CARE_PROVIDER_SITE_OTHER): Payer: PRIVATE HEALTH INSURANCE | Admitting: Internal Medicine

## 2020-07-18 ENCOUNTER — Encounter (INDEPENDENT_AMBULATORY_CARE_PROVIDER_SITE_OTHER): Payer: Self-pay | Admitting: Internal Medicine

## 2020-07-18 VITALS — BP 110/76 | HR 84 | Temp 98.1°F | Ht 75.0 in | Wt 264.6 lb

## 2020-07-18 DIAGNOSIS — E785 Hyperlipidemia, unspecified: Secondary | ICD-10-CM

## 2020-07-18 DIAGNOSIS — R6882 Decreased libido: Secondary | ICD-10-CM

## 2020-07-18 DIAGNOSIS — I2510 Atherosclerotic heart disease of native coronary artery without angina pectoris: Secondary | ICD-10-CM | POA: Diagnosis not present

## 2020-07-18 MED ORDER — TESTOSTERONE CYPIONATE 200 MG/ML IM SOLN: 100.0000 mg | INTRAMUSCULAR | 0 refills | Status: DC

## 2020-07-18 NOTE — Progress Notes (Signed)
Metrics: Intervention Frequency ACO  Documented Smoking Status Yearly  Screened one or more times in 24 months  Cessation Counseling or  Active cessation medication Past 24 months  Past 24 months   Guideline developer: UpToDate (See UpToDate for funding source) Date Released: 2014       Wellness Office Visit  Subjective:  Patient ID: Clifford Lozano, male    DOB: 1975/12/10  Age: 45 y.o. MRN: [id number]  CC: This man comes in for follow-up of dyslipidemia, coronary artery disease, decreased libido and review of blood work that was done in December. HPI  His testosterone levels are on the low end of normal and he clearly has symptoms indicative of this. He continues on statin therapy for dyslipidemia in the face of coronary artery disease.  He denies any chest pain, dyspnea or palpitations. He continues on lisinopril for cardiac protection and hypertension. Past Medical History:  Diagnosis Date  . Coronary artery disease 05/03/2019  . Hyperlipidemia   . Myocardial infarction [facility])    04/28/2019 - STEMI s/p 2 stent placement   Past Surgical History:  Procedure Laterality Date  . CORONARY ANGIOPLASTY WITH STENT PLACEMENT  04/2019  . HERNIA REPAIR  2008     Family History  Problem Relation Age of Onset  . Cancer Mother        Liver Cancer  . Alcohol abuse Father   . Cancer Sister        unknown type    Social History   Social History Narrative   Married for 26 years.Lives with wife and 71 year old son.Works for [employer].   Social History   Tobacco Use  . Smoking status: Never Smoker  . Smokeless tobacco: Former Neurosurgeon    Types: Chew  Substance Use Topics  . Alcohol use: Yes    Comment: weekly    Current Meds  Medication Sig  . aspirin EC 81 MG tablet Take 81 mg by mouth daily.  Marland Kitchen lisinopril (ZESTRIL) 10 MG tablet Take 10 mg by mouth daily.  . nitroGLYCERIN (NITROSTAT) 0.4 MG SL tablet Place 0.4 mg under the tongue every 5 (five) minutes as needed for  chest pain.  . rosuvastatin (CRESTOR) 40 MG tablet Take 40 mg by mouth daily.  Marland Kitchen testosterone cypionate (DEPO-TESTOSTERONE) 200 MG/ML injection Inject 0.5 mLs (100 mg total) into the muscle every 7 (seven) days.  . [DISCONTINUED] lisinopril (ZESTRIL) 5 MG tablet Take 5 mg by mouth daily.  . [DISCONTINUED] omeprazole (PRILOSEC) 20 MG capsule Take 20 mg by mouth daily as needed.      Flowsheet Row Office Visit from 07/18/2020 in [facility]  PHQ-9 Total Score 0      Objective:   Today's Vitals: BP 110/76   Pulse 84   Temp 98.1 F (36.7 C) (Temporal)   Ht 6\' 3"  (1.905 m)   Wt 264 lb 9.6 oz (120 kg)   SpO2 97%   BMI 33.07 kg/m  Vitals with BMI 07/18/2020 04/19/2020 12/29/2019  Height 6\' 3"  6\' 3"  6\' 3"   Weight 264 lbs 10 oz 264 lbs 3 oz 262 lbs  BMI 33.07 33.02 32.75  Systolic 110 133 12/31/2019  Diastolic 76 70 80  Pulse 84 76 79     Physical Exam  He remains obese.  Blood pressure is excellent.     Assessment   1. Hyperlipidemia, unspecified hyperlipidemia type   2. Coronary artery disease involving native heart without angina pectoris, unspecified vessel or lesion type   3.  Decreased libido       Tests ordered No orders of the defined types were placed in this encounter.    Plan: 1. We discussed testosterone therapy at length today and is interested in starting this.  I discussed the FDA warnings, pros and cons, benefits and side effects and mode of administration.  He would like to proceed with testosterone injections and I have sent a prescription to his pharmacy.  Once he obtains the medication, he will come to our office for education on administration. 2. Follow-up in about 6 to 8 weeks to see how he is doing.  Today I spent 30 minutes with this patient discussing testosterone and its benefits as well as possible side effects.   Meds ordered this encounter  Medications  . testosterone cypionate (DEPO-TESTOSTERONE) 200 MG/ML injection    Sig: Inject 0.5  mLs (100 mg total) into the muscle every 7 (seven) days.    Dispense:  10 mL    Refill:  0    Nimish Normajean Glasgow, MD

## 2020-07-19 ENCOUNTER — Telehealth (INDEPENDENT_AMBULATORY_CARE_PROVIDER_SITE_OTHER): Payer: Self-pay | Admitting: Nurse Practitioner

## 2020-07-19 ENCOUNTER — Telehealth (INDEPENDENT_AMBULATORY_CARE_PROVIDER_SITE_OTHER): Payer: Self-pay

## 2020-07-19 NOTE — Telephone Encounter (Signed)
Pt is set up and will go next week on 18. Pt was called and notified.

## 2020-07-19 NOTE — Telephone Encounter (Signed)
Great, thank you!

## 2020-07-19 NOTE — Telephone Encounter (Addendum)
Patient is due for repeat ultrasound of the abdomen to monitor polyps found in gallbladder this month. Polyps were 60mm in size and recommendation by radiologist was repeat u/s in 1 year.   It appears ultrasound is ordered, will you make sure it is approved by insurance and scheduled? Patient saw Dr. Karilyn Cota yesterday, but I am not sure if he is aware he is due for repeat ultrasound so please also make sure the patient is aware. Thank you.

## 2020-07-28 ENCOUNTER — Ambulatory Visit (HOSPITAL_COMMUNITY)
Admission: RE | Admit: 2020-07-28 | Discharge: 2020-07-28 | Disposition: A | Discharge status: Home / Self Care | Payer: PRIVATE HEALTH INSURANCE | Source: Ambulatory Visit | Attending: Nurse Practitioner | Admitting: Nurse Practitioner

## 2020-07-28 DIAGNOSIS — R748 Abnormal levels of other serum enzymes: Secondary | ICD-10-CM | POA: Diagnosis present

## 2020-08-01 ENCOUNTER — Other Ambulatory Visit: Payer: Self-pay

## 2020-08-01 ENCOUNTER — Ambulatory Visit (INDEPENDENT_AMBULATORY_CARE_PROVIDER_SITE_OTHER): Payer: PRIVATE HEALTH INSURANCE

## 2020-08-01 ENCOUNTER — Encounter (INDEPENDENT_AMBULATORY_CARE_PROVIDER_SITE_OTHER): Payer: Self-pay

## 2020-08-01 VITALS — Resp 18 | Ht 75.0 in | Wt 263.0 lb

## 2020-08-01 DIAGNOSIS — R5383 Other fatigue: Secondary | ICD-10-CM

## 2020-08-01 DIAGNOSIS — R6882 Decreased libido: Secondary | ICD-10-CM

## 2020-08-01 MED ORDER — TESTOSTERONE CYPIONATE 200 MG/ML IM SOLN: 100.0000 mg | Freq: Once | INTRAMUSCULAR | Status: DC

## 2020-08-01 NOTE — Progress Notes (Signed)
Pt brought new bottle of Testosterone 200 mg/69ml vial in for 1st Testosterone injections training and education. Pt was given  0.14ml to the rt thigh. Pt tolerated well. No complaints. Pt is to rotate legs for every dose injection. This helps to not be sore or irritation at sites. If he notices any reaction to medication at the site of injections to please STOP & call the office to report the reaction to Dr. Karilyn Cota nursing staff. Pt & spouse will be helping to give injections today.  Explain to patient "safety"to always keep away from small kids, in dry cool area.  Dispose of needles in a resealing bottle/ jug such as a old milk or bleach mark "neeedles". When filled you can bring to office for disposal properly in bio hazard waste at [facility]. Will be back in 6 wk's to have lab work and schedule f/u appointment to see how patient is tolerating medication.   Lot: [id number] Exp: 12/2022

## 2020-09-07 ENCOUNTER — Ambulatory Visit (INDEPENDENT_AMBULATORY_CARE_PROVIDER_SITE_OTHER): Payer: PRIVATE HEALTH INSURANCE | Admitting: Internal Medicine

## 2020-09-19 ENCOUNTER — Other Ambulatory Visit: Payer: Self-pay

## 2020-09-19 ENCOUNTER — Other Ambulatory Visit (INDEPENDENT_AMBULATORY_CARE_PROVIDER_SITE_OTHER): Payer: Self-pay | Admitting: Internal Medicine

## 2020-09-19 ENCOUNTER — Other Ambulatory Visit (INDEPENDENT_AMBULATORY_CARE_PROVIDER_SITE_OTHER): Payer: BC Managed Care – PPO

## 2020-09-19 DIAGNOSIS — R6882 Decreased libido: Secondary | ICD-10-CM

## 2020-09-20 LAB — TESTOSTERONE TOTAL,FREE,BIO, MALES
Albumin: 4.5 g/dL (ref 3.6–5.1)
Sex Hormone Binding: 26 nmol/L (ref 10–50)
Testosterone, Bioavailable: 166 ng/dL (ref 110.0–575.0)
Testosterone, Free: 80.7 pg/mL (ref 46.0–224.0)
Testosterone: 494 ng/dL (ref 250–827)

## 2020-09-26 ENCOUNTER — Encounter (INDEPENDENT_AMBULATORY_CARE_PROVIDER_SITE_OTHER): Payer: Self-pay | Admitting: Internal Medicine

## 2020-09-26 ENCOUNTER — Ambulatory Visit (INDEPENDENT_AMBULATORY_CARE_PROVIDER_SITE_OTHER): Payer: BC Managed Care – PPO | Admitting: Internal Medicine

## 2020-09-26 ENCOUNTER — Other Ambulatory Visit: Payer: Self-pay

## 2020-09-26 VITALS — BP 120/80 | HR 71 | Temp 97.8°F | Resp 18 | Ht 76.0 in | Wt 270.0 lb

## 2020-09-26 DIAGNOSIS — E785 Hyperlipidemia, unspecified: Secondary | ICD-10-CM

## 2020-09-26 DIAGNOSIS — R6882 Decreased libido: Secondary | ICD-10-CM | POA: Diagnosis not present

## 2020-09-26 DIAGNOSIS — I2510 Atherosclerotic heart disease of native coronary artery without angina pectoris: Secondary | ICD-10-CM | POA: Diagnosis not present

## 2020-09-26 NOTE — Progress Notes (Signed)
Metrics: Intervention Frequency ACO  Documented Smoking Status Yearly  Screened one or more times in 24 months  Cessation Counseling or  Active cessation medication Past 24 months  Past 24 months   Guideline developer: UpToDate (See UpToDate for funding source) Date Released: 2014       Wellness Office Visit  Subjective:  Patient ID: Clifford Lozano, male    DOB: 07-09-1975  Age: 45 y.o. MRN: [id number]  CC: This man comes in for follow-up of testosterone therapy, history of hyperlipidemia and coronary artery disease. HPI  He has been taking testosterone therapy once a week and he feels well on this.  He feels he has more energy.  The last 2 weeks, he has been able to work out almost every day with his, strength training.  He feels that his weight circumference is less.  He feels he is gaining muscle.  He denies any chest pain. He continues on statin therapy. Past Medical History:  Diagnosis Date  . Coronary artery disease 05/03/2019  . Hyperlipidemia   . Myocardial infarction [facility])    04/28/2019 - STEMI s/p 2 stent placement   Past Surgical History:  Procedure Laterality Date  . CORONARY ANGIOPLASTY WITH STENT PLACEMENT  04/2019  . HERNIA REPAIR  2008     Family History  Problem Relation Age of Onset  . Cancer Mother        Liver Cancer  . Alcohol abuse Father   . Cancer Sister        unknown type    Social History   Social History Narrative   Married for 26 years.Lives with wife and 60 year old son.Works for [employer].   Social History   Tobacco Use  . Smoking status: Never Smoker  . Smokeless tobacco: Former Neurosurgeon    Types: Chew  Substance Use Topics  . Alcohol use: Yes    Comment: weekly    Current Meds  Medication Sig  . aspirin EC 81 MG tablet Take 81 mg by mouth daily.  Marland Kitchen lisinopril (ZESTRIL) 10 MG tablet Take 10 mg by mouth daily.  . nitroGLYCERIN (NITROSTAT) 0.4 MG SL tablet Place 0.4 mg under the tongue every 5 (five) minutes as needed  for chest pain.  . prasugrel (EFFIENT) 10 MG TABS tablet Take 10 mg by mouth daily.  . rosuvastatin (CRESTOR) 40 MG tablet Take 40 mg by mouth daily.  Marland Kitchen testosterone cypionate (DEPO-TESTOSTERONE) 200 MG/ML injection Inject 0.5 mLs (100 mg total) into the muscle every 7 (seven) days.   Current Facility-Administered Medications for the 09/26/20 encounter (Office Visit) with Wilson Singer, MD  Medication  . testosterone cypionate (DEPOTESTOSTERONE CYPIONATE) injection 100 mg     Flowsheet Row Office Visit from 07/18/2020 in [facility]  PHQ-9 Total Score 0      Objective:   Today's Vitals: BP 120/80 (BP Location: Right Arm, Patient Position: Sitting, Cuff Size: Normal)   Pulse 71   Temp 97.8 F (36.6 C) (Temporal)   Resp 18   Ht 6\' 4"  (1.93 m)   Wt 270 lb (122.5 kg) Comment: heavy work .  SpO2 98%   BMI 32.87 kg/m  Vitals with BMI 09/26/2020 08/01/2020 07/18/2020  Height 6\' 4"  6\' 3"  6\' 3"   Weight 270 lbs 263 lbs 264 lbs 10 oz  BMI 32.88 32.87 33.07  Systolic 120 - 110  Diastolic 80 - 76  Pulse 71 - 84     Physical Exam   Systemically well.  He has  gained 7 pounds but I think most of this is muscle.  Blood pressure is in a good range.     Assessment   1. Decreased libido   2. Hyperlipidemia, unspecified hyperlipidemia type   3. Coronary artery disease involving native heart without angina pectoris, unspecified vessel or lesion type       Tests ordered No orders of the defined types were placed in this encounter.    Plan: 1. Continue with testosterone therapy as before. 2. I will see him in about 3 months time for follow-up and we will probably do some more blood work then.   No orders of the defined types were placed in this encounter.   Wilson Singer, MD

## 2020-11-22 ENCOUNTER — Other Ambulatory Visit (INDEPENDENT_AMBULATORY_CARE_PROVIDER_SITE_OTHER): Payer: Self-pay | Admitting: Internal Medicine

## 2020-11-22 MED ORDER — TESTOSTERONE CYPIONATE 200 MG/ML IM SOLN: 100.0000 mg | INTRAMUSCULAR | 0 refills | Status: AC

## 2020-11-22 NOTE — Telephone Encounter (Signed)
refill 

## 2021-01-02 ENCOUNTER — Ambulatory Visit (INDEPENDENT_AMBULATORY_CARE_PROVIDER_SITE_OTHER): Payer: BC Managed Care – PPO | Admitting: Internal Medicine

## 2022-04-13 IMAGING — US US ABDOMEN COMPLETE
1 series · 14 of 25 positions shown · non-contrast
Comparison: None.

CLINICAL DATA: Elevated LFTs

EXAM:
ABDOMEN ULTRASOUND COMPLETE

[Series 1: us abdomen complete · 14 of 105 slices shown]
[im 1/105]
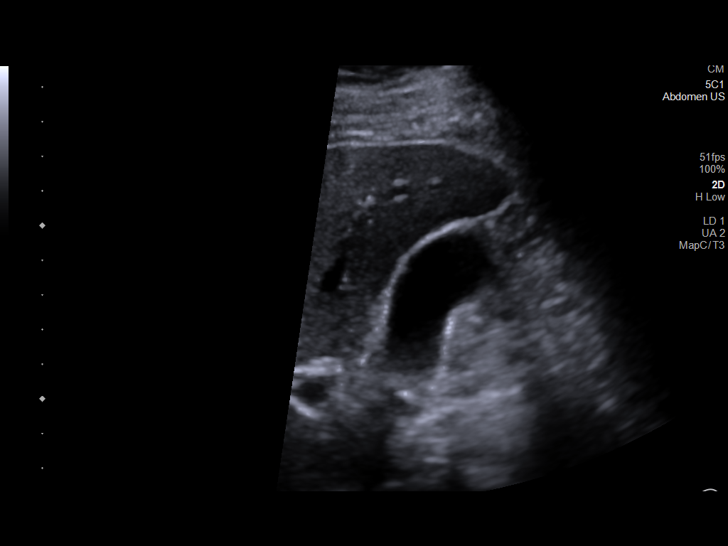
[im 9/105]
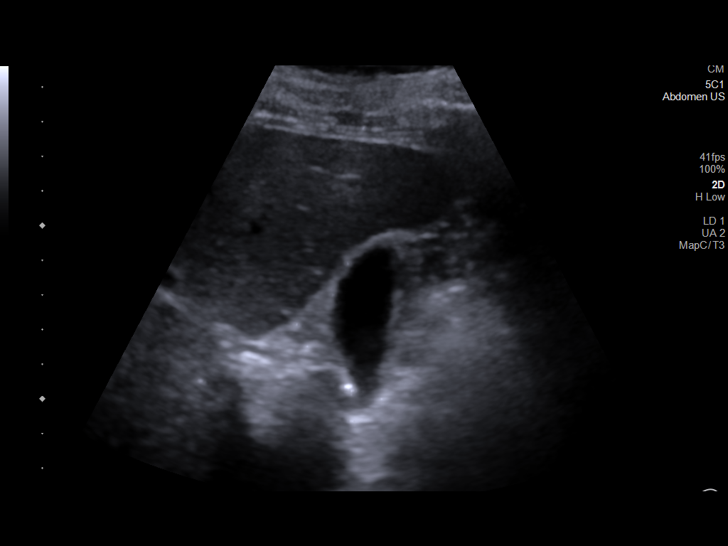
[im 18/105]
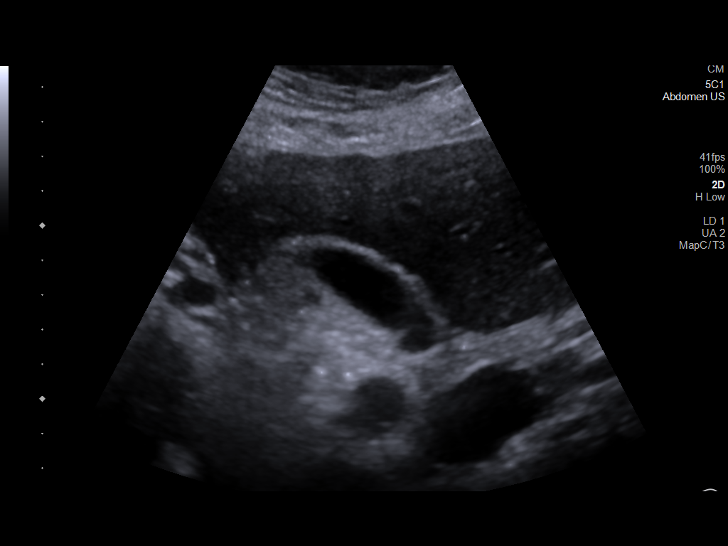
[im 27/105]
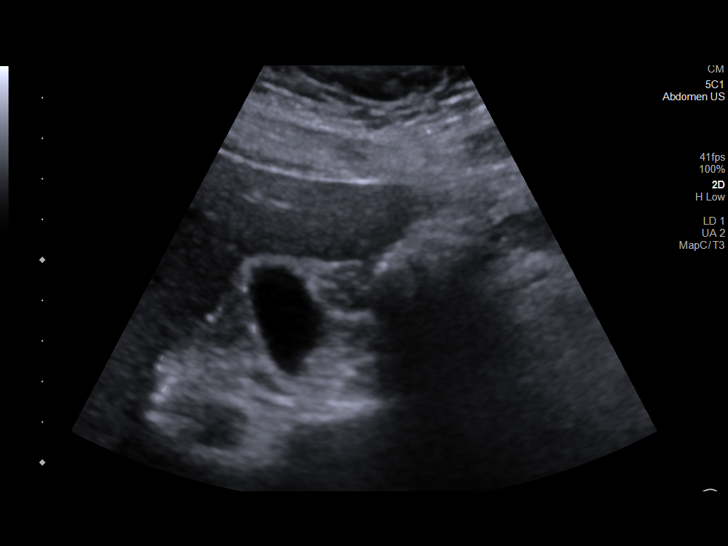
[im 35/105]
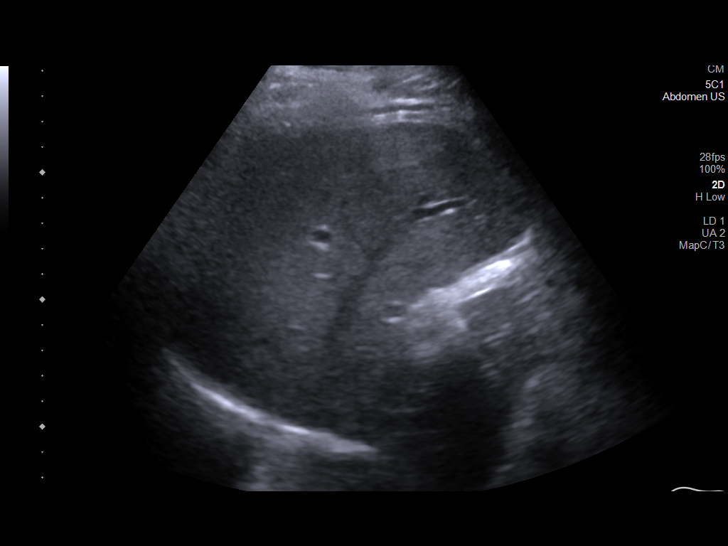
[im 40/105]
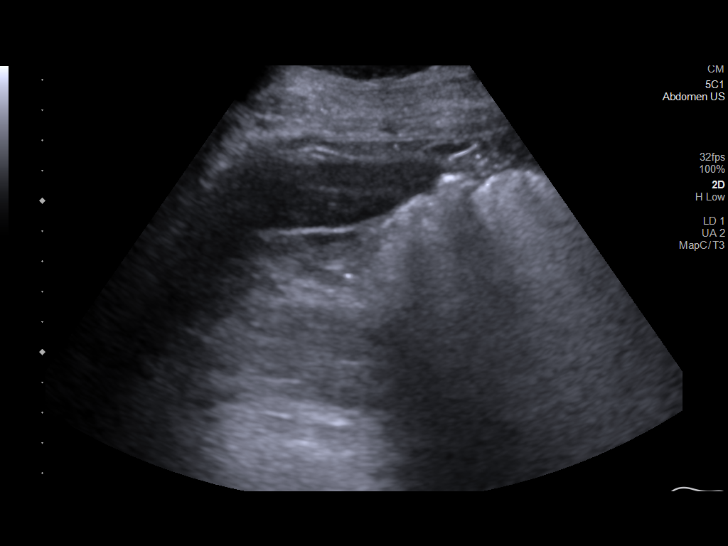
[im 48/105]
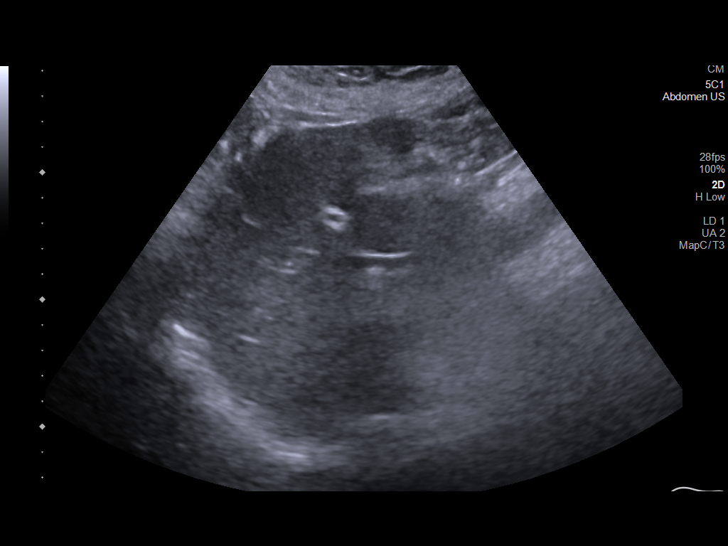
[im 57/105]
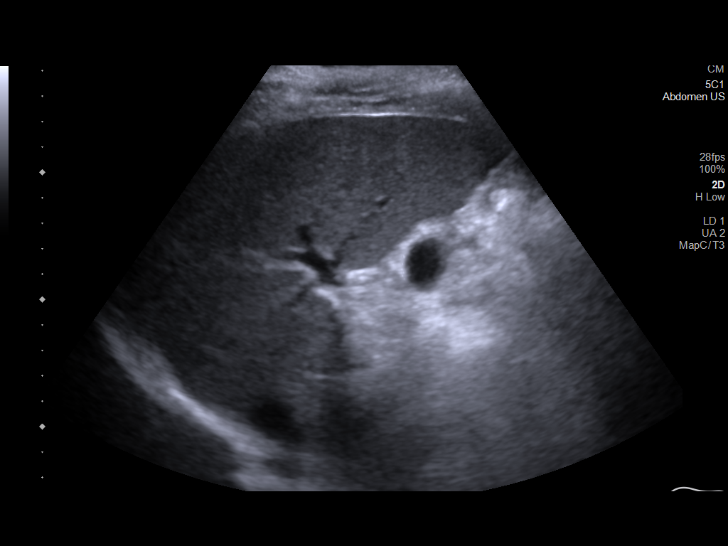
[im 66/105]
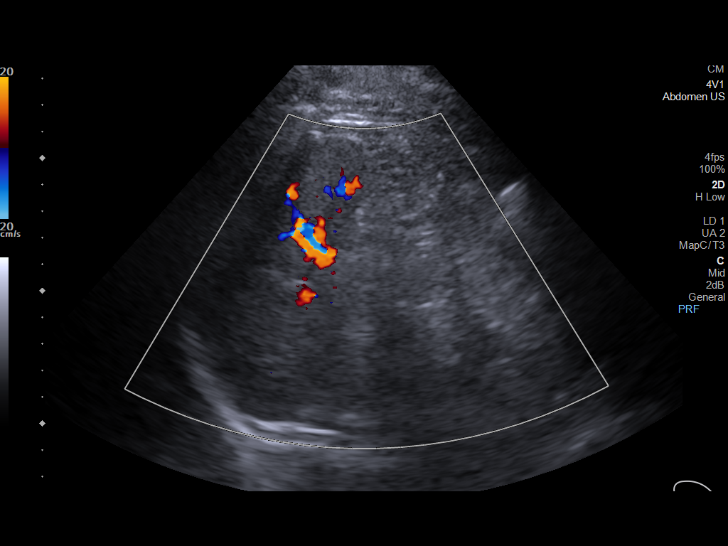
[im 70/105]
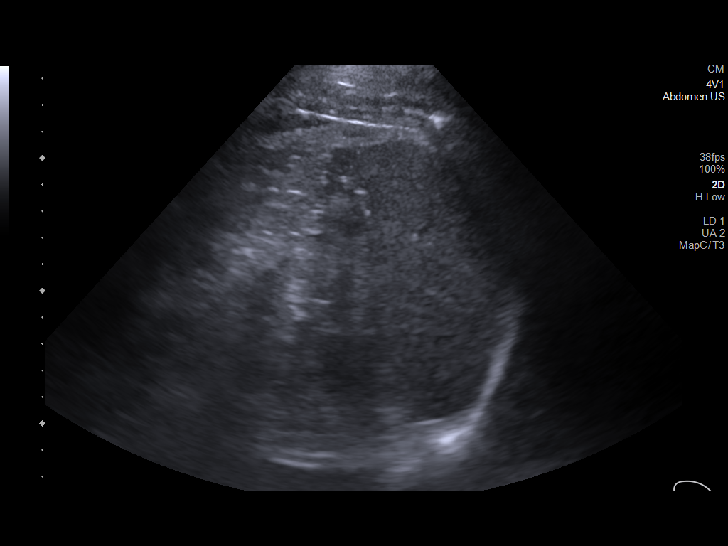
[im 79/105]
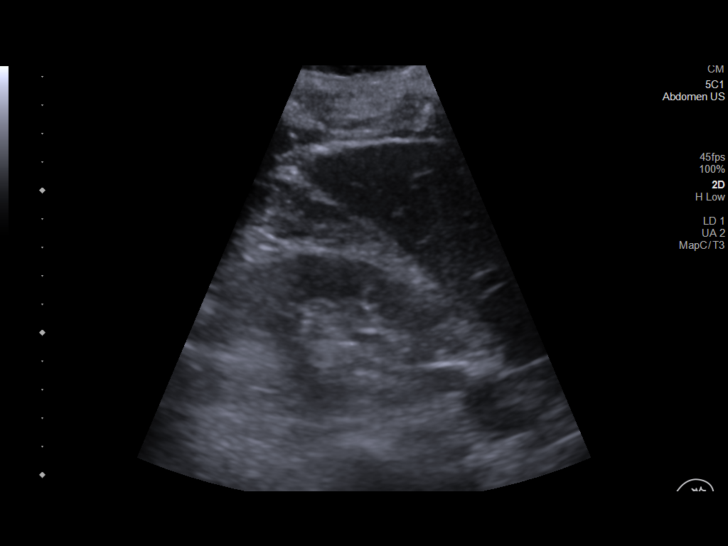
[im 87/105]
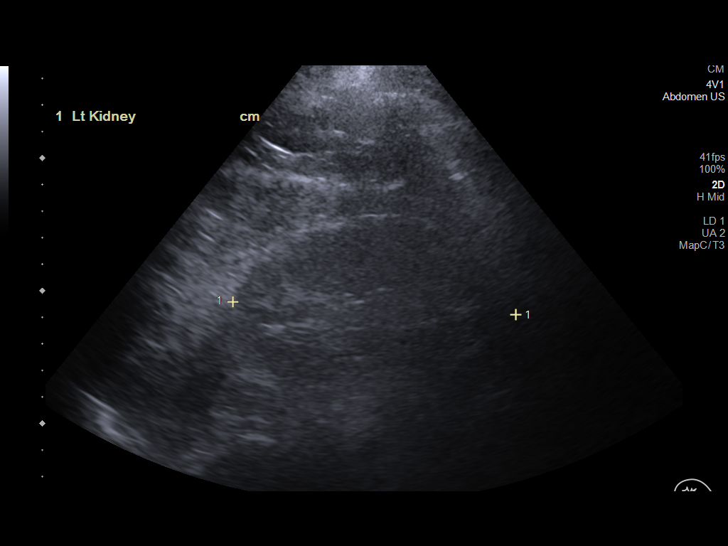
[im 96/105]
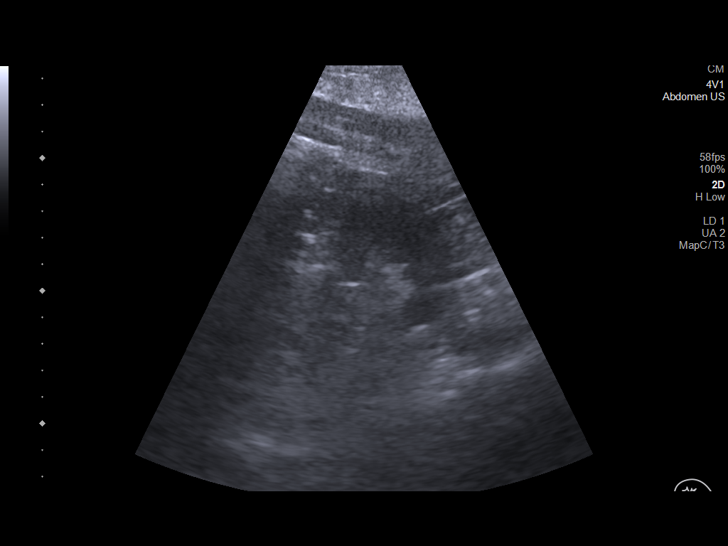
[im 105/105]
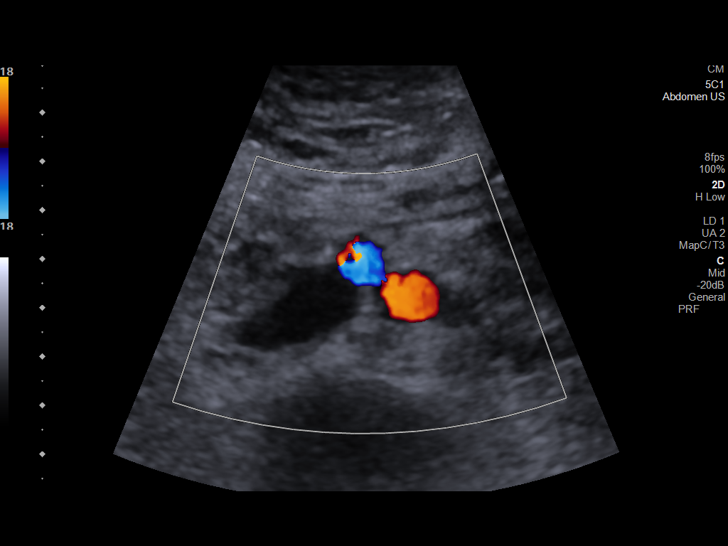

[14 of 25 positions shown; findings below may reference images not displayed]

FINDINGS: Gallbladder: No gallstones or wall thickening visualized. There is a
benign polyp measuring 5 mm. No sonographic Murphy sign noted by
sonographer.

Common bile duct: Diameter: 0.3 cm, within normal limits

Liver: No focal lesion identified. Within normal limits in
parenchymal echogenicity. Portal vein is patent on color Doppler
imaging with normal direction of blood flow towards the liver.

IVC: No abnormality visualized.

Pancreas: Visualized portion unremarkable.

Spleen: Size and appearance within normal limits.

Right Kidney: Length: 10.9 cm. Echogenicity within normal limits. No
mass or hydronephrosis visualized.

Left Kidney: Length: 10.7 cm. Echogenicity within normal limits. No
mass or hydronephrosis visualized.

Abdominal aorta: No aneurysm visualized.

Other findings: None.
IMPRESSION: 1.  Normal sonographic appearance of the liver.

2. Benign gallbladder polyp measuring 5 mm. No dedicated follow-up
necessary.
